# Patient Record
Sex: Male | Born: 1990 | Race: Black or African American | Hispanic: No | Marital: Single | Smoking: Never smoker
Health system: Southern US, Community
[De-identification: ages and names within clinical notes are randomized; demographics above are authoritative.]

---
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
## 2015-11-05 ENCOUNTER — Emergency Department (HOSPITAL_COMMUNITY)
Admission: EM | Admit: 2015-11-05 | Discharge: 2015-11-05 | Disposition: A | Discharge status: Home / Self Care | Payer: No Typology Code available for payment source | Attending: Emergency Medicine | Admitting: Emergency Medicine

## 2015-11-05 ENCOUNTER — Emergency Department (HOSPITAL_COMMUNITY): Payer: No Typology Code available for payment source

## 2015-11-05 ENCOUNTER — Encounter (HOSPITAL_COMMUNITY): Payer: Self-pay | Admitting: Emergency Medicine

## 2015-11-05 DIAGNOSIS — Y999 Unspecified external cause status: Secondary | ICD-10-CM | POA: Diagnosis not present

## 2015-11-05 DIAGNOSIS — M25562 Pain in left knee: Secondary | ICD-10-CM | POA: Diagnosis not present

## 2015-11-05 DIAGNOSIS — Y92410 Unspecified street and highway as the place of occurrence of the external cause: Secondary | ICD-10-CM | POA: Insufficient documentation

## 2015-11-05 DIAGNOSIS — Y939 Activity, unspecified: Secondary | ICD-10-CM | POA: Diagnosis not present

## 2015-11-05 MED ORDER — IBUPROFEN 800 MG PO TABS
800.0000 mg | ORAL_TABLET | Freq: Once | ORAL | Status: AC
  Administered 2015-11-05: 800 mg via ORAL
  Filled 2015-11-05: qty 1

## 2015-11-05 MED ORDER — IBUPROFEN 800 MG PO TABS: 800.0000 mg | ORAL_TABLET | Freq: Three times a day (TID) | ORAL | Status: AC
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
## 2016-08-19 ENCOUNTER — Emergency Department (HOSPITAL_COMMUNITY): Payer: Self-pay

## 2016-08-19 ENCOUNTER — Encounter (HOSPITAL_COMMUNITY): Payer: Self-pay

## 2016-08-19 ENCOUNTER — Emergency Department (HOSPITAL_COMMUNITY)
Admission: EM | Admit: 2016-08-19 | Discharge: 2016-08-19 | Disposition: A | Discharge status: Home / Self Care | Payer: Self-pay | Attending: Emergency Medicine | Admitting: Emergency Medicine

## 2016-08-19 DIAGNOSIS — R0789 Other chest pain: Secondary | ICD-10-CM | POA: Insufficient documentation

## 2016-08-19 MED ORDER — GLUCAGON HCL RDNA (DIAGNOSTIC) 1 MG IJ SOLR
1.0000 mg | Freq: Once | INTRAMUSCULAR | Status: DC
Start: 2016-08-19 — End: 2016-08-19

## 2016-08-19 NOTE — ED Triage Notes (Signed)
Pt states he has had congestion as well as "tingling" above his rib cage X2 weeks. Pt denies cough. No distress noted.

## 2016-08-19 NOTE — ED Provider Notes (Signed)
[facility]-EMERGENCY DEPT Provider Note   CSN: [id number] Arrival date & time: 08/19/16  9735     History   Chief Complaint Chief Complaint  Patient presents with  . Nasal Congestion    HPI Douglas Sutton is a 26 y.o. male.  He presents today for evaluation of left anterior chest "tingling."  He reports that this has been going on since February and is associated with shortness of breath and feeling like his chest is congested/ there is a pressure on his chest but no chest pain.  He reports that it has been gradually getting worse since February.  He denies any cough, allergy or asthma history, post nasal drip, nasal congestion, recent illness or trauma.  He reports his shortness of breath is worse with laying supine and with exertion.  His shortness of breath is made better by sitting up and rest.  Denies leg swelling, palpitations or wheezing.  He denies a personal cardiac or pulmonary history.  No family history of cardiac events before the age of 90.        History reviewed. No pertinent past medical history.  There are no active problems to display for this patient.   History reviewed. No pertinent surgical history.     Home Medications    Prior to Admission medications   Medication Sig Start Date End Date Taking? Authorizing Provider  ibuprofen (ADVIL,MOTRIN) 800 MG tablet Take 1 tablet (800 mg total) by mouth 3 (three) times daily. Patient not taking: Reported on 08/19/2016 11/05/15   Alveta Heimlich, PA-C    Family History History reviewed. No pertinent family history.  Social History Social History  Substance Use Topics  . Smoking status: Never Smoker  . Smokeless tobacco: Never Used  . Alcohol use Yes     Comment: occ     Allergies   Patient has no known allergies.   Review of Systems Review of Systems  Constitutional: Negative for activity change, appetite change, chills, diaphoresis, fatigue and fever.  HENT: Negative for ear pain and sore throat.     Eyes: Negative for pain and visual disturbance.  Respiratory: Positive for chest tightness and shortness of breath. Negative for cough and wheezing.   Cardiovascular: Negative for chest pain and palpitations.  Gastrointestinal: Negative for abdominal pain, diarrhea, nausea and vomiting.  Genitourinary: Negative for dysuria and hematuria.  Musculoskeletal: Negative for arthralgias and back pain.  Skin: Negative for color change and rash.  Neurological: Negative for seizures, syncope and light-headedness.  All other systems reviewed and are negative.    Physical Exam Updated Vital Signs BP 130/68 (BP Location: Right Arm)   Pulse 72   Temp 98.8 F (37.1 C) (Oral)   Resp 14   Ht  (1.778 m)   Wt 91.2 kg   SpO2 99%   BMI 28.84 kg/m   Physical Exam  Constitutional: He appears well-developed and well-nourished. No distress.  HENT:  Head: Normocephalic and atraumatic.  Eyes: Conjunctivae are normal. Right eye exhibits no discharge. Left eye exhibits no discharge.  Neck: Neck supple. No JVD present. No tracheal deviation present.  Cardiovascular: Normal rate, regular rhythm, S1 normal, S2 normal, normal heart sounds, intact distal pulses and normal pulses.   No extrasystoles are present. Exam reveals no gallop and no friction rub.   No murmur heard. Pulmonary/Chest: Effort normal and breath sounds normal. No accessory muscle usage or stridor. No tachypnea and no bradypnea. No respiratory distress. He has no wheezes. He has no rhonchi. He has  no rales. He exhibits no tenderness, no laceration, no crepitus, no edema, no deformity, no swelling and no retraction.  Abdominal: Soft. Normal appearance and bowel sounds are normal. He exhibits no ascites. There is no hepatosplenomegaly. There is no tenderness. There is no rigidity and no guarding.  Musculoskeletal: He exhibits no edema.  Neurological: He is alert. No sensory deficit. He exhibits normal muscle tone.  Skin: Skin is warm and  dry. He is not diaphoretic.  Psychiatric: He has a normal mood and affect. His behavior is normal.  Nursing note and vitals reviewed.    ED Treatments / Results  Labs (all labs ordered are listed, but only abnormal results are displayed) Labs Reviewed - No data to display  EKG  EKG Interpretation  Date/Time:  Monday August 19 2016 10:52:15 EDT Ventricular Rate:  71 PR Interval:    QRS Duration: 82 QT Interval:  377 QTC Calculation: 410 R Axis:   78 Text Interpretation:  Sinus rhythm Normal ECG No old tracing to compare Confirmed by GOLDSTON MD, SCOTT [phone]) on 08/19/2016 11:13:57 AM       Radiology Dg Chest 2 View  Result Date: 08/19/2016 CLINICAL DATA:  Left lower chest pain for 1 month with mild shortness of breath. EXAM: CHEST  2 VIEW COMPARISON:  None. FINDINGS: The lungs are clear. Heart size is normal. No pneumothorax or pleural effusion. No bony abnormality. IMPRESSION: Negative chest. Electronically Signed   By: Drusilla Kanner M.D.   On: 08/19/2016 11:11    Procedures Procedures (including critical care time)  Medications Ordered in ED Medications - No data to display   Initial Impression / Assessment and Plan / ED Course  I have reviewed the triage vital signs and the nursing notes.  Pertinent labs & imaging results that were available during my care of the patient were reviewed by me and considered in my medical decision making (see chart for details).    Douglas Sutton presented today for evaluation of lower chest tingling and shortness of breath since February.  Patient is to be discharged with recommendation to follow up with [facility] or PCP in regards to today's hospital visit. Chest pressure/symptoms is not likely of cardiac or pulmonary etiology d/t presentation, PERC negative, VSS, no tracheal deviation, no JVD or new murmur, RRR, breath sounds equal bilaterally, EKG without acute abnormalities and negative CXR. Pt has been advised to return to the  ED if CP becomes exertional, associated with diaphoresis or nausea, radiates to left jaw/arm, worsens or becomes concerning in any way. Pt appears reliable for follow up and is agreeable to discharge.   Case has been discussed with and seen by Dr. Criss Alvine who agrees with the above plan to discharge.     Final Clinical Impressions(s) / ED Diagnoses   Final diagnoses:  Chest wall discomfort    New Prescriptions New Prescriptions   No medications on file     Cristina Gong, PA-C 08/19/16 1124    Pricilla Loveless, MD 08/21/16 [phone]

## 2016-11-22 ENCOUNTER — Encounter (HOSPITAL_COMMUNITY): Payer: Self-pay

## 2016-11-22 ENCOUNTER — Emergency Department (HOSPITAL_COMMUNITY)
Admission: EM | Admit: 2016-11-22 | Discharge: 2016-11-22 | Disposition: A | Discharge status: Home / Self Care | Payer: No Typology Code available for payment source | Attending: Emergency Medicine | Admitting: Emergency Medicine

## 2016-11-22 ENCOUNTER — Emergency Department (HOSPITAL_COMMUNITY): Payer: No Typology Code available for payment source

## 2016-11-22 DIAGNOSIS — M25522 Pain in left elbow: Secondary | ICD-10-CM | POA: Insufficient documentation

## 2016-11-22 DIAGNOSIS — Y999 Unspecified external cause status: Secondary | ICD-10-CM | POA: Diagnosis not present

## 2016-11-22 DIAGNOSIS — M79602 Pain in left arm: Secondary | ICD-10-CM | POA: Insufficient documentation

## 2016-11-22 DIAGNOSIS — M79642 Pain in left hand: Secondary | ICD-10-CM | POA: Diagnosis present

## 2016-11-22 DIAGNOSIS — M25532 Pain in left wrist: Secondary | ICD-10-CM | POA: Insufficient documentation

## 2016-11-22 DIAGNOSIS — Y929 Unspecified place or not applicable: Secondary | ICD-10-CM | POA: Diagnosis not present

## 2016-11-22 DIAGNOSIS — Y93I9 Activity, other involving external motion: Secondary | ICD-10-CM | POA: Insufficient documentation

## 2016-11-22 MED ORDER — IBUPROFEN 400 MG PO TABS
600.0000 mg | ORAL_TABLET | Freq: Once | ORAL | Status: AC
  Administered 2016-11-22: 600 mg via ORAL
  Filled 2016-11-22: qty 1
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
## 2016-11-22 NOTE — ED Notes (Signed)
On way to XR 

## 2017-01-25 ENCOUNTER — Emergency Department (HOSPITAL_COMMUNITY): Payer: Self-pay

## 2017-01-25 ENCOUNTER — Emergency Department (HOSPITAL_COMMUNITY)
Admission: EM | Admit: 2017-01-25 | Discharge: 2017-01-26 | Disposition: A | Discharge status: Home / Self Care | Payer: Self-pay | Attending: Emergency Medicine | Admitting: Emergency Medicine

## 2017-01-25 ENCOUNTER — Encounter (HOSPITAL_COMMUNITY): Payer: Self-pay | Admitting: Emergency Medicine

## 2017-01-25 DIAGNOSIS — Z23 Encounter for immunization: Secondary | ICD-10-CM | POA: Insufficient documentation

## 2017-01-25 DIAGNOSIS — W540XXA Bitten by dog, initial encounter: Secondary | ICD-10-CM

## 2017-01-25 DIAGNOSIS — R112 Nausea with vomiting, unspecified: Secondary | ICD-10-CM | POA: Insufficient documentation

## 2017-01-25 DIAGNOSIS — S61431A Puncture wound without foreign body of right hand, initial encounter: Secondary | ICD-10-CM | POA: Insufficient documentation

## 2017-01-25 DIAGNOSIS — Y92099 Unspecified place in other non-institutional residence as the place of occurrence of the external cause: Secondary | ICD-10-CM | POA: Insufficient documentation

## 2017-01-25 DIAGNOSIS — Y9389 Activity, other specified: Secondary | ICD-10-CM | POA: Insufficient documentation

## 2017-01-25 DIAGNOSIS — Y998 Other external cause status: Secondary | ICD-10-CM | POA: Insufficient documentation

## 2017-01-25 NOTE — ED Triage Notes (Addendum)
Reports being bit on right hand by Pit bull.  Owner unsure about dog's vaccination status.

## 2017-01-26 MED ORDER — AMOXICILLIN-POT CLAVULANATE 875-125 MG PO TABS
1.0000 | ORAL_TABLET | Freq: Once | ORAL | Status: AC
  Administered 2017-01-26: 1 via ORAL
  Filled 2017-01-26: qty 1

## 2017-01-26 MED ORDER — RABIES VACCINE, PCEC IM SUSR
1.0000 mL | Freq: Once | INTRAMUSCULAR | Status: AC
  Administered 2017-01-26: 1 mL via INTRAMUSCULAR
  Filled 2017-01-26: qty 1

## 2017-01-26 MED ORDER — RABIES IMMUNE GLOBULIN 150 UNIT/ML IM INJ
20.0000 [IU]/kg | INJECTION | Freq: Once | INTRAMUSCULAR | Status: AC
  Administered 2017-01-26: 1800 [IU] via INTRAMUSCULAR
  Filled 2017-01-26: qty 12

## 2017-01-26 MED ORDER — TETANUS-DIPHTH-ACELL PERTUSSIS 5-2.5-18.5 LF-MCG/0.5 IM SUSP
0.5000 mL | Freq: Once | INTRAMUSCULAR | Status: AC
  Administered 2017-01-26: 0.5 mL via INTRAMUSCULAR
  Filled 2017-01-26: qty 0.5

## 2017-01-26 MED ORDER — AMOXICILLIN-POT CLAVULANATE 875-125 MG PO TABS: 1.0000 | ORAL_TABLET | Freq: Two times a day (BID) | ORAL | 0 refills | Status: AC

## 2017-01-26 MED ORDER — ONDANSETRON HCL 4 MG/2ML IJ SOLN
4.0000 mg | Freq: Once | INTRAMUSCULAR | Status: AC
  Administered 2017-01-26: 4 mg via INTRAVENOUS
  Filled 2017-01-26: qty 2

## 2017-01-26 NOTE — ED Notes (Addendum)
Pt had severe vomiting episode while administering meds; PA aware and at bedside; pt a&ox 4

## 2017-01-26 NOTE — ED Provider Notes (Signed)
Tolerates PO.  DC to home.   Roxy HorsemanBrowning, Alvin Diffee, PA-C 01/26/17 0330
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
## 2017-01-26 NOTE — Discharge Instructions (Signed)
As discussed, please take entire course of antibiotics even if you feel well. Monitor for any signs of infection or worsening of your hand, including swelling, redness, purulence, worsening pain, fever, chills and is seen immediately if you experience any of those symptoms.  Make sure that you communicate with the owners of the dog to confirm immunization status so that you may stop having rabies vaccination series.  You will have to return at day 3, 7, 14 for follow-up immunization with rabies vaccines.
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
